# Patient Record
Sex: Female | Born: 1965 | Race: White | Hispanic: No | Marital: Married | Smoking: Never smoker
Health system: Southern US, Community
[De-identification: ages and names within clinical notes are randomized; demographics above are authoritative.]

## PROBLEM LIST (undated history)

## (undated) DIAGNOSIS — I10 Essential (primary) hypertension: Secondary | ICD-10-CM

---

## 2003-07-24 ENCOUNTER — Ambulatory Visit (HOSPITAL_COMMUNITY): Admission: RE | Admit: 2003-07-24 | Discharge: 2003-07-24 | Payer: Self-pay | Admitting: Family Medicine

## 2009-09-09 ENCOUNTER — Ambulatory Visit (HOSPITAL_BASED_OUTPATIENT_CLINIC_OR_DEPARTMENT_OTHER): Admission: RE | Admit: 2009-09-09 | Discharge: 2009-09-09 | Payer: Self-pay | Admitting: General Surgery

## 2010-04-11 LAB — POCT HEMOGLOBIN-HEMACUE: Hemoglobin: 14 g/dL (ref 12.0–15.0)

## 2010-07-29 ENCOUNTER — Telehealth (INDEPENDENT_AMBULATORY_CARE_PROVIDER_SITE_OTHER): Payer: Self-pay | Admitting: General Surgery

## 2010-07-29 NOTE — Telephone Encounter (Signed)
Patient called, spoke with Carol Ana A. Re: referral to Dr Yolanda Bonine for a mgm. Patient stated she found a lump in her breast and that Dr Yolanda Bonine needed an order to perform a mgm. Pt called 07/16/10. Dr Andrey Campanile was out of the office all week, but I did send him an email telling him what was going on with Carol Contreras on 07/17/10 in which he responded to order a mgm and ultrasound. I called patient and left a message for her to call me back. I did not hear anything and left another message 07/28/10 for patient to call back. At this time I am still awaiting a call back from the patient.

## 2010-09-17 ENCOUNTER — Encounter (INDEPENDENT_AMBULATORY_CARE_PROVIDER_SITE_OTHER): Payer: Self-pay | Admitting: General Surgery

## 2014-10-29 ENCOUNTER — Encounter (HOSPITAL_COMMUNITY): Payer: Self-pay | Admitting: *Deleted

## 2014-10-29 ENCOUNTER — Emergency Department (HOSPITAL_COMMUNITY): Payer: BC Managed Care – PPO

## 2014-10-29 ENCOUNTER — Observation Stay (HOSPITAL_COMMUNITY)
Admission: EM | Admit: 2014-10-29 | Discharge: 2014-10-30 | Disposition: A | Discharge status: Home / Self Care | Payer: BC Managed Care – PPO | Attending: Internal Medicine | Admitting: Internal Medicine

## 2014-10-29 DIAGNOSIS — M79605 Pain in left leg: Secondary | ICD-10-CM | POA: Diagnosis not present

## 2014-10-29 DIAGNOSIS — I10 Essential (primary) hypertension: Secondary | ICD-10-CM | POA: Insufficient documentation

## 2014-10-29 DIAGNOSIS — R079 Chest pain, unspecified: Principal | ICD-10-CM | POA: Insufficient documentation

## 2014-10-29 DIAGNOSIS — I160 Hypertensive urgency: Secondary | ICD-10-CM | POA: Diagnosis not present

## 2014-10-29 DIAGNOSIS — R072 Precordial pain: Secondary | ICD-10-CM

## 2014-10-29 HISTORY — DX: Essential (primary) hypertension: I10

## 2014-10-29 LAB — CBC
HCT: 39.3 % (ref 36.0–46.0)
HEMOGLOBIN: 13.3 g/dL (ref 12.0–15.0)
MCH: 31.3 pg (ref 26.0–34.0)
MCHC: 33.8 g/dL (ref 30.0–36.0)
MCV: 92.5 fL (ref 78.0–100.0)
Platelets: 349 10*3/uL (ref 150–400)
RBC: 4.25 MIL/uL (ref 3.87–5.11)
RDW: 11.9 % (ref 11.5–15.5)
WBC: 8 10*3/uL (ref 4.0–10.5)

## 2014-10-29 LAB — APTT: aPTT: 30 seconds (ref 24–37)

## 2014-10-29 LAB — PROTIME-INR
INR: 1.07 (ref 0.00–1.49)
PROTHROMBIN TIME: 14.1 s (ref 11.6–15.2)

## 2014-10-29 LAB — BASIC METABOLIC PANEL WITH GFR
Anion gap: 9 (ref 5–15)
BUN: 12 mg/dL (ref 6–20)
CO2: 25 mmol/L (ref 22–32)
Calcium: 8.7 mg/dL — ABNORMAL LOW (ref 8.9–10.3)
Chloride: 101 mmol/L (ref 101–111)
Creatinine, Ser: 0.82 mg/dL (ref 0.44–1.00)
GFR calc Af Amer: >60 mL/min
GFR calc non Af Amer: >60 mL/min
Glucose, Bld: 100 mg/dL — ABNORMAL HIGH (ref 65–99)
Potassium: 3.5 mmol/L (ref 3.5–5.1)
Sodium: 135 mmol/L (ref 135–145)

## 2014-10-29 LAB — I-STAT TROPONIN, ED
TROPONIN I, POC: 0 ng/mL (ref 0.00–0.08)
Troponin i, poc: 0 ng/mL (ref 0.00–0.08)

## 2014-10-29 MED ORDER — LISINOPRIL 20 MG PO TABS
20.0000 mg | ORAL_TABLET | Freq: Every day | ORAL | Status: DC
  Administered 2014-10-30: 20 mg via ORAL
  Filled 2014-10-29: qty 1

## 2014-10-29 MED ORDER — ASPIRIN EC 81 MG PO TBEC: 81.0000 mg | DELAYED_RELEASE_TABLET | Freq: Every day | ORAL | Status: DC

## 2014-10-29 MED ORDER — MORPHINE SULFATE (PF) 2 MG/ML IV SOLN: 2.0000 mg | INTRAVENOUS | Status: DC | PRN

## 2014-10-29 MED ORDER — NITROGLYCERIN 0.4 MG SL SUBL: 0.4000 mg | SUBLINGUAL_TABLET | SUBLINGUAL | Status: DC | PRN

## 2014-10-29 MED ORDER — SODIUM CHLORIDE 0.9 % IJ SOLN
3.0000 mL | Freq: Two times a day (BID) | INTRAMUSCULAR | Status: DC
  Administered 2014-10-29 – 2014-10-30 (×2): 3 mL via INTRAVENOUS

## 2014-10-29 MED ORDER — NITROGLYCERIN 0.2 MG/HR TD PT24
0.2000 mg | MEDICATED_PATCH | Freq: Every day | TRANSDERMAL | Status: DC
  Administered 2014-10-30: 0.2 mg via TRANSDERMAL
  Filled 2014-10-29: qty 1

## 2014-10-29 MED ORDER — ACETAMINOPHEN 650 MG RE SUPP: 650.0000 mg | Freq: Four times a day (QID) | RECTAL | Status: DC | PRN

## 2014-10-29 MED ORDER — ONDANSETRON HCL 4 MG/2ML IJ SOLN: 4.0000 mg | Freq: Four times a day (QID) | INTRAMUSCULAR | Status: DC | PRN

## 2014-10-29 MED ORDER — NITROGLYCERIN 0.4 MG SL SUBL
0.4000 mg | SUBLINGUAL_TABLET | SUBLINGUAL | Status: DC | PRN
Start: 2014-10-29 — End: 2014-10-29
  Administered 2014-10-29: 0.4 mg via SUBLINGUAL
  Filled 2014-10-29: qty 1

## 2014-10-29 MED ORDER — SODIUM CHLORIDE 0.9 % IV SOLN
INTRAVENOUS | Status: DC
  Administered 2014-10-29: 23:00:00 via INTRAVENOUS

## 2014-10-29 MED ORDER — ONDANSETRON HCL 4 MG PO TABS: 4.0000 mg | ORAL_TABLET | Freq: Four times a day (QID) | ORAL | Status: DC | PRN

## 2014-10-29 MED ORDER — HEPARIN SODIUM (PORCINE) 5000 UNIT/ML IJ SOLN
5000.0000 [IU] | Freq: Three times a day (TID) | INTRAMUSCULAR | Status: DC
  Administered 2014-10-30: 5000 [IU] via SUBCUTANEOUS
  Filled 2014-10-29: qty 1

## 2014-10-29 MED ORDER — ATORVASTATIN CALCIUM 40 MG PO TABS
40.0000 mg | ORAL_TABLET | Freq: Every day | ORAL | Status: DC
  Administered 2014-10-29: 40 mg via ORAL
  Filled 2014-10-29: qty 1

## 2014-10-29 MED ORDER — ASPIRIN EC 325 MG PO TBEC
325.0000 mg | DELAYED_RELEASE_TABLET | Freq: Every day | ORAL | Status: DC
  Administered 2014-10-30: 325 mg via ORAL
  Filled 2014-10-29: qty 1

## 2014-10-29 MED ORDER — HYDRALAZINE HCL 20 MG/ML IJ SOLN: 5.0000 mg | INTRAMUSCULAR | Status: DC | PRN

## 2014-10-29 MED ORDER — LISINOPRIL 20 MG PO TABS: 20.0000 mg | ORAL_TABLET | Freq: Every day | ORAL | Status: DC

## 2014-10-29 MED ORDER — ALUM & MAG HYDROXIDE-SIMETH 200-200-20 MG/5ML PO SUSP: 30.0000 mL | Freq: Four times a day (QID) | ORAL | Status: DC | PRN

## 2014-10-29 MED ORDER — ACETAMINOPHEN 500 MG PO TABS
1000.0000 mg | ORAL_TABLET | Freq: Once | ORAL | Status: DC
  Filled 2014-10-29: qty 2

## 2014-10-29 MED ORDER — ACETAMINOPHEN 325 MG PO TABS
650.0000 mg | ORAL_TABLET | Freq: Four times a day (QID) | ORAL | Status: DC | PRN
  Administered 2014-10-30: 650 mg via ORAL
  Filled 2014-10-29: qty 2

## 2014-10-29 NOTE — ED Provider Notes (Signed)
CSN: [id number]     Arrival date & time 10/29/14  1619 History   First MD Initiated Contact with Patient 10/29/14 1902     Chief Complaint  Patient presents with  . Chest Pain  . Hypertension     (Consider location/radiation/quality/duration/timing/severity/associated sxs/prior Treatment) Patient is a 49 y.o. female presenting with chest pain and hypertension. The history is provided by the patient.  Chest Pain Pain location:  L chest Pain quality: pressure   Pain radiates to:  Neck Pain severity:  Moderate Onset quality:  Gradual Timing:  Constant Progression:  Unchanged Chronicity:  New Context: at rest   Relieved by:  Nitroglycerin Worsened by:  Nothing tried Associated symptoms: no abdominal pain, no cough, no fever, no shortness of breath and not vomiting   Hypertension Associated symptoms include chest pain. Pertinent negatives include no abdominal pain and no shortness of breath.    History reviewed. No pertinent past medical history. History reviewed. No pertinent past surgical history. History reviewed. No pertinent family history. Social History  Substance Use Topics  . Smoking status: Never Smoker   . Smokeless tobacco: None  . Alcohol Use: Yes     Comment: occ   OB History    No data available     Review of Systems  Constitutional: Negative for fever.  Respiratory: Negative for cough and shortness of breath.   Cardiovascular: Positive for chest pain.  Gastrointestinal: Negative for vomiting and abdominal pain.  All other systems reviewed and are negative.     Allergies  Review of patient's allergies indicates no known allergies.  Home Medications   Prior to Admission medications   Not on File   BP 175/85 mmHg  Pulse 63  Temp(Src) 98.3 F (36.8 C) (Oral)  Resp 22  Ht  (1.575 m)  Wt 152 lb (68.947 kg)  BMI 27.79 kg/m2  SpO2 100%  LMP 10/22/2014 Physical Exam  Constitutional: She is oriented to person, place, and time. She appears  well-developed and well-nourished. No distress.  HENT:  Head: Normocephalic and atraumatic.  Mouth/Throat: Oropharynx is clear and moist.  Eyes: EOM are normal. Pupils are equal, round, and reactive to light.  Neck: Normal range of motion. Neck supple.  Cardiovascular: Normal rate and regular rhythm.  Exam reveals no friction rub.   No murmur heard. Pulmonary/Chest: Effort normal and breath sounds normal. No respiratory distress. She has no wheezes. She has no rales.  Abdominal: Soft. She exhibits no distension. There is no tenderness. There is no rebound.  Musculoskeletal: Normal range of motion. She exhibits no edema.  Neurological: She is alert and oriented to person, place, and time.  Skin: No rash noted. She is not diaphoretic.  Nursing note and vitals reviewed.   ED Course  Procedures (including critical care time) Labs Review Labs Reviewed  BASIC METABOLIC PANEL - Abnormal; Notable for the following:    Glucose, Bld 100 (*)    Calcium 8.7 (*)    All other components within normal limits  CBC  I-STAT TROPOININ, ED  I-STAT TROPOININ, ED    Imaging Review Dg Chest 2 View  10/29/2014   CLINICAL DATA:  Chest pain radiating to the left shoulder with elevated blood pressure. Initial encounter.  EXAM: CHEST  2 VIEW  COMPARISON:  07/24/2003 radiographs.  FINDINGS: The heart size and mediastinal contours are normal. The lungs are clear. There is no pleural effusion or pneumothorax. No acute osseous findings are identified.  IMPRESSION: Stable chest.  No active cardiopulmonary  process.   Electronically Signed   By: Carey Bullocks M.D.   On: 10/29/2014 18:07   I have personally reviewed and evaluated these images and lab results as part of my medical decision-making.   EKG Interpretation   Date/Time:  Monday October 29 2014 16:28:48 EDT Ventricular Rate:  79 PR Interval:  130 QRS Duration: 74 QT Interval:  392 QTC Calculation: 449 R Axis:   67 Text Interpretation:  Normal  sinus rhythm Possible Left atrial enlargement  Nonspecific ST abnormality Abnormal ECG No prior for comparison Confirmed  by Gwendolyn Grant  MD, Kerrigan Glendening ([id number]) on 10/29/2014 7:07:05 PM      MDM   Final diagnoses:  Chest pain, unspecified chest pain type    49 year old female here with chest pain concerning for ACS. EKG is normal and her initial troponin is normal. She did have chest pain that was responsive to nitroglycerin. He still having mild, 2 out of 10 chest pain here. Given nitroglycerin with relief. I spoke with Dr. Shirlee Latch of cardiology, he recommended hospitalist admission. Heart score is 4 and I feel she is to be admitted. Patient amenable to staying.    Elwin Mocha, MD 10/30/14 [phone]

## 2014-10-29 NOTE — ED Notes (Signed)
MD at bedside. 

## 2014-10-29 NOTE — ED Notes (Addendum)
Pt reports recent HTN, has appt at pcp tomorrow but having mid to upper chest heaviness since last night. Reports SBP 190 pta, pt went to pcp office and was given  ASA and 1 nitro. Denies sob. ekg done on arrival.

## 2014-10-29 NOTE — H&P (Signed)
[facility] History and Physical  Carol Contreras EAV:[id number] DOB: 13-Mar-1965 DOA: 10/29/2014  Referring physician: ED physician PCP: Neldon Labella, MD  Specialists:   Chief Complaint: Chest pain  HPI: Carol Contreras is a 49 y.o. female with PMH of hypertension, not on medications, who presents with chest pain.  Periprostatic she has hypertension for more than 5 years, but not taking any medications at home. She started having chest pain since last night. Chest pain is located in the left chest, constant, 7 out of 10 in severity, nonradiating. It is not aggravated or alleviated by any known factors. No cough, fever or chills. She reports that she has gurgling in her heart in the night. Reports that she has a left leg pain for long time, which only happens in the night. It is alleviated by moving. She does not have back pain. Patient does not have abdominal pain, nausea, vomiting, diarrhea, rashes, unilateral weakness. Patient was treated with aspirin and nitroglycerin in Ed. Currently her chest pain is mild, 1 out of 10 in severity.  In ED, patient was found to have elevated blood pressure with SBP 190, negative troponin, negative chest x-ray, temperature normal, no tachycardia, electrolytes okay. Patient is admitted to inpatient for further eval and treatment. Cardiology was consulted to the ED.  Where does patient live?   At home  Can patient participate in ADLs?  Yes      Review of Systems:   General: no fevers, chills, no changes in body weight, has fatigue HEENT: no blurry vision, hearing changes or sore throat Pulm: no dyspnea, coughing, wheezing CV: has chest pain, no palpitations Abd: no nausea, vomiting, abdominal pain, diarrhea, constipation GU: no dysuria, burning on urination, increased urinary frequency, hematuria  Ext: no leg edema. Has left leg pain. Neuro: no unilateral weakness, numbness, or tingling, no vision change or hearing loss Skin: no rash MSK: No  muscle spasm, no deformity, no limitation of range of movement in spin Heme: No easy bruising.  Travel history: No recent long distant travel.  Allergy: No Known Allergies  Past Medical History  Diagnosis Date  . Essential hypertension     History reviewed. No pertinent past surgical history.  Social History:  reports that she has never smoked. She does not have any smokeless tobacco history on file. She reports that she drinks alcohol. She reports that she does not use illicit drugs.  Family History:  Family History  Problem Relation Age of Onset  . Heart disease Mother   . Hypertension Father   . Hypertension Sister   . Drug abuse Brother      Prior to Admission medications   Not on File    Physical Exam: Filed Vitals:   10/29/14 1945 10/29/14 2000 10/29/14 2237 10/30/14 0500  BP: 168/85 175/85 153/81 163/80  Pulse: 59 63 69 55  Temp:   98 F (36.7 C) 98.4 F (36.9 C)  TempSrc:    Oral  Resp: Height:    (1.575 m)   Weight:   70.897 kg (156 lb 4.8 oz)   SpO2: 99% 100% 95% 97%   General: Not in acute distress HEENT:       Eyes: PERRL, EOMI, no scleral icterus.       ENT: No discharge from the ears and nose, no pharynx injection, no tonsillar enlargement.        Neck: No JVD, no bruit, no mass felt. Heme: No neck lymph node enlargement.  Cardiac: S1/S2, RRR, No murmurs, No gallops or rubs. Pulm: No rales, wheezing, rhonchi or rubs. Abd: Soft, nondistended, nontender, no rebound pain, no organomegaly, BS present. Ext: No pitting leg edema bilaterally. 2+DP/PT pulse bilaterally. Musculoskeletal: No joint deformities, No joint redness or warmth, no limitation of ROM in spin. Skin: No rashes.  Neuro: Alert, oriented X3, cranial nerves II-XII grossly intact, muscle strength 5/5 in all extremities, sensation to light touch intact. Brachial reflex 2+ bilaterally. Knee reflex 1+ bilaterally. Negative Babinski's sign. Normal finger to nose test. Psych:  Patient is not psychotic, no suicidal or hemocidal ideation.  Labs on Admission:  Basic Metabolic Panel:  Recent Labs Lab 10/29/14 1654 10/30/14 0450  NA 135 140  K 3.5 4.8  CL 101 108  CO2 25 27  GLUCOSE 100* 101*  BUN 12 15  CREATININE 0.82 0.80  CALCIUM 8.7* 8.5*   Liver Function Tests:  Recent Labs Lab 10/30/14 0450  AST 12*  ALT 11*  ALKPHOS 64  BILITOT 0.7  PROT 5.4*  ALBUMIN 3.1*   No results for input(s): LIPASE, AMYLASE in the last 168 hours. No results for input(s): AMMONIA in the last 168 hours. CBC:  Recent Labs Lab 10/29/14 1654 10/30/14 0450  WBC 8.0 5.7  HGB 13.3 11.9*  HCT 39.3 35.2*  MCV 92.5 93.9  PLT 349 273   Cardiac Enzymes:  Recent Labs Lab 10/29/14 2320 10/30/14 0450  TROPONINI <0.03 <0.03    BNP (last 3 results) No results for input(s): BNP in the last 8760 hours.  ProBNP (last 3 results) No results for input(s): PROBNP in the last 8760 hours.  CBG: No results for input(s): GLUCAP in the last 168 hours.  Radiological Exams on Admission: Dg Chest 2 View  10/29/2014   CLINICAL DATA:  Chest pain radiating to the left shoulder with elevated blood pressure. Initial encounter.  EXAM: CHEST  2 VIEW  COMPARISON:  07/24/2003 radiographs.  FINDINGS: The heart size and mediastinal contours are normal. The lungs are clear. There is no pleural effusion or pneumothorax. No acute osseous findings are identified.  IMPRESSION: Stable chest.  No active cardiopulmonary process.   Electronically Signed   By: Carey Bullocks M.D.   On: 10/29/2014 18:07    EKG: Independently reviewed.  Abnormal findings:  Slight ST depression in V5-V6   Assessment/Plan Principal Problem:   Chest pain Active Problems:   Hypertensive urgency   Leg pain, left  Chest pain: It is most likely due to demanding ischemic secondary to hypertensive urgency. Troponin was negative. EKG showed mild ST depression in V5-V6. Cardiology was consulted by ED.  - will admit  to Tele bed  - cycle CE q6 x3 and repeat her EKG in the am  - Nitroglycerin patch, prn Morphine, and aspirin, lipitor  - Risk factor stratification: will check FLP,  A1C, and UDS - 2d ehco - f/u cardiology recommendations  Hypertensive urgency: -Start lisinopril. 20 mg daily -IV hydralazine when necessary -on NTG patch  Leg pain, left: Etiology is not clear. No alarming symptoms. Neurologic examination is normal  -prn tylenol    DVT ppx: SQ Heparin    Code Status: Full code Family Communication: None at bed side. Disposition Plan: Admit to inpatient   Date of Service 10/30/2014    Lorretta Harp [facility] Pager [phone]  If 7PM-7AM, please contact night-coverage [email or web address] Password [facility] 10/30/2014, 6:49 AM

## 2014-10-30 ENCOUNTER — Encounter (HOSPITAL_COMMUNITY): Payer: Self-pay | Admitting: Internal Medicine

## 2014-10-30 ENCOUNTER — Inpatient Hospital Stay (HOSPITAL_COMMUNITY): Payer: BC Managed Care – PPO

## 2014-10-30 ENCOUNTER — Other Ambulatory Visit (HOSPITAL_COMMUNITY): Payer: BC Managed Care – PPO

## 2014-10-30 DIAGNOSIS — R072 Precordial pain: Secondary | ICD-10-CM

## 2014-10-30 DIAGNOSIS — M79605 Pain in left leg: Secondary | ICD-10-CM | POA: Diagnosis present

## 2014-10-30 DIAGNOSIS — I160 Hypertensive urgency: Secondary | ICD-10-CM | POA: Diagnosis not present

## 2014-10-30 DIAGNOSIS — R079 Chest pain, unspecified: Secondary | ICD-10-CM | POA: Diagnosis not present

## 2014-10-30 DIAGNOSIS — I10 Essential (primary) hypertension: Secondary | ICD-10-CM | POA: Diagnosis not present

## 2014-10-30 LAB — CBC
HCT: 35.2 % — ABNORMAL LOW (ref 36.0–46.0)
Hemoglobin: 11.9 g/dL — ABNORMAL LOW (ref 12.0–15.0)
MCH: 31.7 pg (ref 26.0–34.0)
MCHC: 33.8 g/dL (ref 30.0–36.0)
MCV: 93.9 fL (ref 78.0–100.0)
PLATELETS: 273 10*3/uL (ref 150–400)
RBC: 3.75 MIL/uL — ABNORMAL LOW (ref 3.87–5.11)
RDW: 12.1 % (ref 11.5–15.5)
WBC: 5.7 10*3/uL (ref 4.0–10.5)

## 2014-10-30 LAB — TROPONIN I: Troponin I: <0.03 ng/mL (ref <0.031)

## 2014-10-30 LAB — LIPID PANEL
CHOL/HDL RATIO: 2.9 ratio
Cholesterol: 182 mg/dL (ref 0–200)
HDL: 63 mg/dL (ref >40)
LDL CALC: 97 mg/dL (ref 0–99)
TRIGLYCERIDES: 110 mg/dL (ref <150)
VLDL: 22 mg/dL (ref 0–40)

## 2014-10-30 LAB — COMPREHENSIVE METABOLIC PANEL
ALBUMIN: 3.1 g/dL — AB (ref 3.5–5.0)
ALT: 11 U/L — ABNORMAL LOW (ref 14–54)
ANION GAP: 5 (ref 5–15)
AST: 12 U/L — ABNORMAL LOW (ref 15–41)
Alkaline Phosphatase: 64 U/L (ref 38–126)
BUN: 15 mg/dL (ref 6–20)
CHLORIDE: 108 mmol/L (ref 101–111)
CO2: 27 mmol/L (ref 22–32)
Calcium: 8.5 mg/dL — ABNORMAL LOW (ref 8.9–10.3)
Creatinine, Ser: 0.8 mg/dL (ref 0.44–1.00)
GFR calc non Af Amer: >60 mL/min (ref >60)
GLUCOSE: 101 mg/dL — AB (ref 65–99)
POTASSIUM: 4.8 mmol/L (ref 3.5–5.1)
SODIUM: 140 mmol/L (ref 135–145)
Total Bilirubin: 0.7 mg/dL (ref 0.3–1.2)
Total Protein: 5.4 g/dL — ABNORMAL LOW (ref 6.5–8.1)

## 2014-10-30 LAB — NM MYOCAR MULTI W/SPECT W/WALL MOTION / EF
CSEPED: 8 min
Estimated workload: 10.1 METS
Exercise duration (sec): 44 s
MPHR: 170 {beats}/min
Peak HR: 462 {beats}/min
Percent HR: 95 %
Rest HR: 84 {beats}/min

## 2014-10-30 LAB — RAPID URINE DRUG SCREEN, HOSP PERFORMED
Amphetamines: NOT DETECTED
BARBITURATES: NOT DETECTED
BENZODIAZEPINES: NOT DETECTED
COCAINE: NOT DETECTED
OPIATES: NOT DETECTED
TETRAHYDROCANNABINOL: NOT DETECTED

## 2014-10-30 LAB — HCG, QUANTITATIVE, PREGNANCY: hCG, Beta Chain, Quant, S: 1 m[IU]/mL (ref <5)

## 2014-10-30 MED ORDER — TECHNETIUM TC 99M SESTAMIBI GENERIC - CARDIOLITE
10.0000 | Freq: Once | INTRAVENOUS | Status: AC | PRN
  Administered 2014-10-30: 10 via INTRAVENOUS

## 2014-10-30 MED ORDER — METOPROLOL SUCCINATE ER 50 MG PO TB24: 50.0000 mg | ORAL_TABLET | Freq: Every day | ORAL | Status: AC

## 2014-10-30 MED ORDER — TRAZODONE HCL 50 MG PO TABS: 50.0000 mg | ORAL_TABLET | Freq: Every evening | ORAL | Status: AC | PRN

## 2014-10-30 MED ORDER — TECHNETIUM TC 99M SESTAMIBI GENERIC - CARDIOLITE
30.0000 | Freq: Once | INTRAVENOUS | Status: AC | PRN
  Administered 2014-10-30: 30 via INTRAVENOUS

## 2014-10-30 NOTE — Progress Notes (Signed)
     The patient was seen in nuclear medicine for a exercise myoview. She tolerated the procedure well. No acute ST or TW changes on ECG. Await nuclear images.    Thereasa Parkin PA-C  [facility]

## 2014-10-30 NOTE — Discharge Summary (Signed)
Physician Discharge Summary  Carol Contreras ZOX:[id number] DOB: 1965/10/04 DOA: 10/29/2014  PCP: Neldon Labella, MD  Admit date: 10/29/2014 Discharge date: 10/30/2014   Recommendations for Outpatient Follow-up:  1. If chest pain continues on empiric PPI, consider EGD 2. Monitor blood pressure  Discharge Diagnoses:  Principal Problem:   Chest pain Active Problems:   Hypertensive urgency insomnia   Discharge Condition: stable  Diet recommendation: heart healthy  Filed Weights   10/29/14 1630 10/29/14 2237  Weight: 68.947 kg (152 lb) 70.897 kg (156 lb 4.8 oz)    History of present illness:  49 y.o. female with PMH of hypertension, not on medications, who presents with chest pain.  Periprostatic she has hypertension for more than 5 years, but not taking any medications at home. She started having chest pain since last night. Chest pain is located in the left chest, constant, 7 out of 10 in severity, nonradiating. It is not aggravated or alleviated by any known factors. No cough, fever or chills. She reports that she has gurgling in her heart in the night. Reports that she has a left leg pain for long time, which only happens in the night. It is alleviated by moving. She does not have back pain. Patient does not have abdominal pain, nausea, vomiting, diarrhea, rashes, unilateral weakness. Patient was treated with aspirin and nitroglycerin in Ed. Currently her chest pain is mild, 1 out of 10 in severity.  In ED, patient was found to have elevated blood pressure with SBP 190, negative troponin, negative chest x-ray, temperature normal, no tachycardia, electrolytes okay. Patient is admitted to inpatient for further eval and treatment. Cardiology was consulted to the ED.  Hospital Course:  Observed on telemetry.  Started on antihypertensives.  MI ruled out. Pain and blood pressure improved. Had low risk myoview. Will start trial of PPI in case GERD.   Will need outpt blood pressure check.   If chest pain continues, consider EGD.  Patient also asked for medication for insomnia.  rx for trazodone given and f/u PCP  Procedures:  none  Consultations:  none  Discharge Exam: Filed Vitals:   10/30/14 1457  BP: 143/75  Pulse: 79  Temp: 97.6 F (36.4 C)  Resp:     General: comfortable Cardiovascular: RRR. No chest wall tenderness Respiratory: CTA without WRR  Discharge Instructions   Discharge Instructions    Diet - low sodium heart healthy    Complete by:  As directed           Current Discharge Medication List    START taking these medications   Details  metoprolol succinate (TOPROL XL) 50 MG 24 hr tablet Take 1 tablet (50 mg total) by mouth daily. Take with or immediately following a meal. Qty: 30 tablet, Refills: 0    traZODone (DESYREL) 50 MG tablet Take 1 tablet (50 mg total) by mouth at bedtime as needed for sleep. Qty: 30 tablet, Refills: 0       No Known Allergies Follow-up Information    Follow up with Neldon Labella, MD.   Specialty:  Family Medicine   Contact information:   [address] [place] [address] [phone]        The results of significant diagnostics from this hospitalization (including imaging, microbiology, ancillary and laboratory) are listed below for reference.    Significant Diagnostic Studies: Dg Chest 2 View  10/29/2014   CLINICAL DATA:  Chest pain radiating to the left shoulder with elevated blood pressure. Initial encounter.  EXAM: CHEST  2 VIEW  COMPARISON:  07/24/2003 radiographs.  FINDINGS: The heart size and mediastinal contours are normal. The lungs are clear. There is no pleural effusion or pneumothorax. No acute osseous findings are identified.  IMPRESSION: Stable chest.  No active cardiopulmonary process.   Electronically Signed   By: Carey Bullocks M.D.   On: 10/29/2014 18:07   Nm Myocar Multi W/spect W/wall Motion / Ef  10/30/2014   CLINICAL DATA:  49 year old with precordial chest pain.   EXAM: MYOCARDIAL IMAGING WITH SPECT (REST AND EXERCISE)  GATED LEFT VENTRICULAR WALL MOTION STUDY  LEFT VENTRICULAR EJECTION FRACTION  TECHNIQUE: Standard myocardial SPECT imaging was performed after resting intravenous injection of 10 mCi Tc-56m sestamibi. Subsequently, exercise tolerance test was performed by the patient under the supervision of the Cardiology staff. At peak-stress, 30 mCi Tc-67m sestamibi was injected intravenously and standard myocardial SPECT imaging was performed. Quantitative gated imaging was also performed to evaluate left ventricular wall motion, and estimate left ventricular ejection fraction.  COMPARISON:  Chest radiograph 10/29/2014  FINDINGS: Perfusion: No decreased activity in the left ventricle on stress imaging to suggest reversible ischemia or infarction.  Wall Motion: Normal left ventricular wall motion. No left ventricular dilation.  Left Ventricular Ejection Fraction: 61 %  End diastolic volume 82 ml  End systolic volume 32 ml  IMPRESSION: 1. No reversible ischemia or infarction.  2. Normal left ventricular wall motion.  3. Left ventricular ejection fraction is 61%.  4. Low-risk stress test findings*.  *2012 Appropriate Use Criteria for Coronary Revascularization Focused Update: J Am Coll Cardiol. 2012;59(9):857-881. http://content.dementiazones.com.aspx?articleid=1201161   Electronically Signed   By: Richarda Overlie M.D.   On: 10/30/2014 14:16    Microbiology: No results found for this or any previous visit (from the past 240 hour(s)).   Labs: Basic Metabolic Panel:  Recent Labs Lab 10/29/14 1654 10/30/14 0450  NA 135 140  K 3.5 4.8  CL 101 108  CO2 25 27  GLUCOSE 100* 101*  BUN 12 15  CREATININE 0.82 0.80  CALCIUM 8.7* 8.5*   Liver Function Tests:  Recent Labs Lab 10/30/14 0450  AST 12*  ALT 11*  ALKPHOS 64  BILITOT 0.7  PROT 5.4*  ALBUMIN 3.1*   No results for input(s): LIPASE, AMYLASE in the last 168 hours. No results for input(s): AMMONIA  in the last 168 hours. CBC:  Recent Labs Lab 10/29/14 1654 10/30/14 0450  WBC 8.0 5.7  HGB 13.3 11.9*  HCT 39.3 35.2*  MCV 92.5 93.9  PLT 349 273   Cardiac Enzymes:  Recent Labs Lab 10/29/14 2320 10/30/14 0450 10/30/14 1014  TROPONINI <0.03 <0.03 <0.03   BNP: BNP (last 3 results) No results for input(s): BNP in the last 8760 hours.  ProBNP (last 3 results) No results for input(s): PROBNP in the last 8760 hours.  CBG: No results for input(s): GLUCAP in the last 168 hours.     SignedChristiane Ha  [facility] 10/30/2014, 3:20 PM

## 2014-10-30 NOTE — Consult Note (Signed)
CARDIOLOGY CONSULT NOTE  Patient ID: Carol Contreras, MRN: [id number], DOB/AGE: May 21, 1965 49 y.o. Admit date: 10/29/2014 Date of Consult: 10/30/2014  Primary Physician: Neldon Labella, MD Primary Cardiologist: none Referring Physician: Dr Clyde Lundborg  Chief Complaint: Chest pain Reason for Consultation: Chest pain  HPI: This is a 48 year old woman with long-standing untreated hypertension who presents for evaluation of chest pain. Yesterday she began having chest pain and she went to her doctor. She had planned on seeing her physician today to begin treatment for hypertension. The patient has been checking her blood pressure periodically with readings as high as 190s over 100s. She describes substernal chest discomfort radiating to the left chest. She was given sublingual nitroglycerin and her symptoms resolved. Her blood pressure also came down into the normal range after nitroglycerin. She is chest pain-free this morning. She's had periodic chest pain over recent weeks. In the past she describes a feeling of her chest "gurgling" but the symptoms only last for a few seconds. She has no personal history of heart disease. She denies any previous hospitalizations. She does not smoke cigarettes.  She also associates headaches and a feeling of "heaviness" behind her eyes associated with her high blood pressure.  Medical History:  Past Medical History  Diagnosis Date  . Essential hypertension       Surgical History: History reviewed. No pertinent past surgical history.   Home Meds: Prior to Admission medications   Not on File    Inpatient Medications:  . acetaminophen  1,000 mg Oral Once  . aspirin EC  325 mg Oral Daily  . atorvastatin  40 mg Oral q1800  . heparin  5,000 Units Subcutaneous 3 times per day  . lisinopril  20 mg Oral Daily  . nitroGLYCERIN  0.2 mg Transdermal Daily  . sodium chloride  3 mL Intravenous Q12H   . sodium chloride 100 mL/hr at 10/29/14 2258    Allergies: No  Known Allergies  Social History   Social History  . Marital Status: Married    Spouse Name: N/A  . Number of Children: N/A  . Years of Education: N/A   Occupational History  . Not on file.   Social History Main Topics  . Smoking status: Never Smoker   . Smokeless tobacco: Not on file  . Alcohol Use: Yes     Comment: occ  . Drug Use: No  . Sexual Activity: Not on file   Other Topics Concern  . Not on file   Social History Narrative     Family History  Problem Relation Age of Onset  . Heart disease Mother   . Hypertension Father   . Hypertension Sister   . Drug abuse Brother      Review of Systems: General: negative for chills, fever, night sweats or weight changes.  ENT: negative for rhinorrhea or epistaxis Cardiovascular: See history of present illness Dermatological: negative for rash Respiratory: negative for cough or wheezing GI: negative for nausea, vomiting, diarrhea, bright red blood per rectum, melena, or hematemesis GU: no hematuria, urgency, or frequency Neurologic: negative for visual changes, syncope, headache, or dizziness Heme: no easy bruising or bleeding Endo: negative for excessive thirst, thyroid disorder, or flushing Musculoskeletal: Positive for left knee pain   All other systems reviewed and are otherwise negative except as noted above.  Physical Exam: Blood pressure 163/80, pulse 55, temperature 98.4 F (36.9 C), temperature source Oral, resp. rate 18, height  (1.575 m), weight 156 lb 4.8 oz (70.897  kg), last menstrual period 10/22/2014, SpO2 97 %. Pt is alert and oriented, WD, WN, in no distress. HEENT: normal Neck: JVP normal. Carotid upstrokes normal without bruits. No thyromegaly. Lungs: equal expansion, clear bilaterally CV: Apex is discrete and nondisplaced, RRR without murmur or gallop Abd: soft, NT, +BS, no bruit, no hepatosplenomegaly Back: no CVA tenderness Ext: no C/C/E        DP/PT pulses intact and = Skin: warm and  dry without rash Neuro: CNII-XII intact             Strength intact = bilaterally    Labs:  Recent Labs  10/29/14 2320 10/30/14 0450  TROPONINI <0.03 <0.03   Lab Results  Component Value Date   WBC 5.7 10/30/2014   HGB 11.9* 10/30/2014   HCT 35.2* 10/30/2014   MCV 93.9 10/30/2014   PLT 273 10/30/2014    Recent Labs Lab 10/30/14 0450  NA 140  K 4.8  CL 108  CO2 27  BUN 15  CREATININE 0.80  CALCIUM 8.5*  PROT 5.4*  BILITOT 0.7  ALKPHOS 64  ALT 11*  AST 12*  GLUCOSE 101*   Lab Results  Component Value Date   CHOL 182 10/30/2014   HDL 63 10/30/2014   LDLCALC 97 10/30/2014   TRIG 110 10/30/2014   No results found for: DDIMER  Radiology/Studies:  Dg Chest 2 View  10/29/2014   CLINICAL DATA:  Chest pain radiating to the left shoulder with elevated blood pressure. Initial encounter.  EXAM: CHEST  2 VIEW  COMPARISON:  07/24/2003 radiographs.  FINDINGS: The heart size and mediastinal contours are normal. The lungs are clear. There is no pleural effusion or pneumothorax. No acute osseous findings are identified.  IMPRESSION: Stable chest.  No active cardiopulmonary process.   Electronically Signed   By: Carey Bullocks M.D.   On: 10/29/2014 18:07    EKG: Normal sinus rhythm, within normal limits  Cardiac Studies: Troponin negative 3  ASSESSMENT AND PLAN:  1. Substernal chest pain, moderate risk for acute coronary syndrome 2. Hypertensive urgency - blood pressure remains above goal but improved with administration of oral anti-hypertensive medications  Recommendations: The patient is counseled about the need for ongoing treatment of her hypertension which is been uncontrolled for many years. Will defer to the primary team for specific anti-hypertensive medications. Recommended an exercise Myoview stress test to evaluate the patient's chest pain and rule out cardiac ischemia. Further plans pending the results of her nuclear scan. As long as her stress test is  normal/low risk, I would anticipate discharge later today on oral antihypertensives therapy. Will follow-up on her stress test results. The patient has been started on aspirin and a statin drug. Lipids show a cholesterol of 182 and LDL 97. Pending the results of her stress test, a decision can be made whether she will need to stay on a statin drug.  SignedTonny Bollman MD, [facility] 10/30/2014, 8:50 AM

## 2014-10-31 LAB — HEMOGLOBIN A1C
HEMOGLOBIN A1C: 5.7 % — AB (ref 4.8–5.6)
Mean Plasma Glucose: 117 mg/dL

## 2014-11-16 ENCOUNTER — Telehealth (HOSPITAL_COMMUNITY): Payer: Self-pay | Admitting: *Deleted

## 2014-12-04 ENCOUNTER — Other Ambulatory Visit: Payer: Self-pay | Admitting: Cardiovascular Disease

## 2014-12-04 ENCOUNTER — Other Ambulatory Visit: Payer: Self-pay

## 2014-12-04 ENCOUNTER — Ambulatory Visit (HOSPITAL_COMMUNITY): Payer: BC Managed Care – PPO | Attending: Cardiovascular Disease

## 2014-12-04 DIAGNOSIS — I071 Rheumatic tricuspid insufficiency: Secondary | ICD-10-CM | POA: Insufficient documentation

## 2014-12-04 DIAGNOSIS — R079 Chest pain, unspecified: Secondary | ICD-10-CM

## 2014-12-04 DIAGNOSIS — I340 Nonrheumatic mitral (valve) insufficiency: Secondary | ICD-10-CM | POA: Insufficient documentation

## 2014-12-04 DIAGNOSIS — I10 Essential (primary) hypertension: Secondary | ICD-10-CM | POA: Diagnosis not present

## 2014-12-06 ENCOUNTER — Telehealth: Payer: Self-pay | Admitting: *Deleted

## 2014-12-06 NOTE — Telephone Encounter (Signed)
Received call here in the [place] office.  Patient inquiring abut her recent Echo and wanting results.  Patient stated that her PMD (Dr. Hyacinth MeekerMiller) had requested this test.  Patient has not been seen in office before, only the hospital.  Informed patient that I would forward her test results to the ordering provider (pmd).  Patient appreciative & verbalized understanding.

## 2014-12-10 ENCOUNTER — Telehealth: Payer: Self-pay | Admitting: Cardiovascular Disease

## 2014-12-10 NOTE — Telephone Encounter (Signed)
Left detailed message on the pt's voicemail with Echo results.

## 2014-12-10 NOTE — Telephone Encounter (Signed)
New message      Calling to get echo results.  Pt is a Engineer, siteschool teacher.  She said you can leave a detailed message on her cell phone

## 2017-08-28 IMAGING — DX DG CHEST 2V
2 series · 2 of 2 positions shown · non-contrast
Comparison: 07/24/2003 radiographs.

CLINICAL DATA: Chest pain radiating to the left shoulder with
elevated blood pressure. Initial encounter.

EXAM:
CHEST  2 VIEW

[w chest pa]
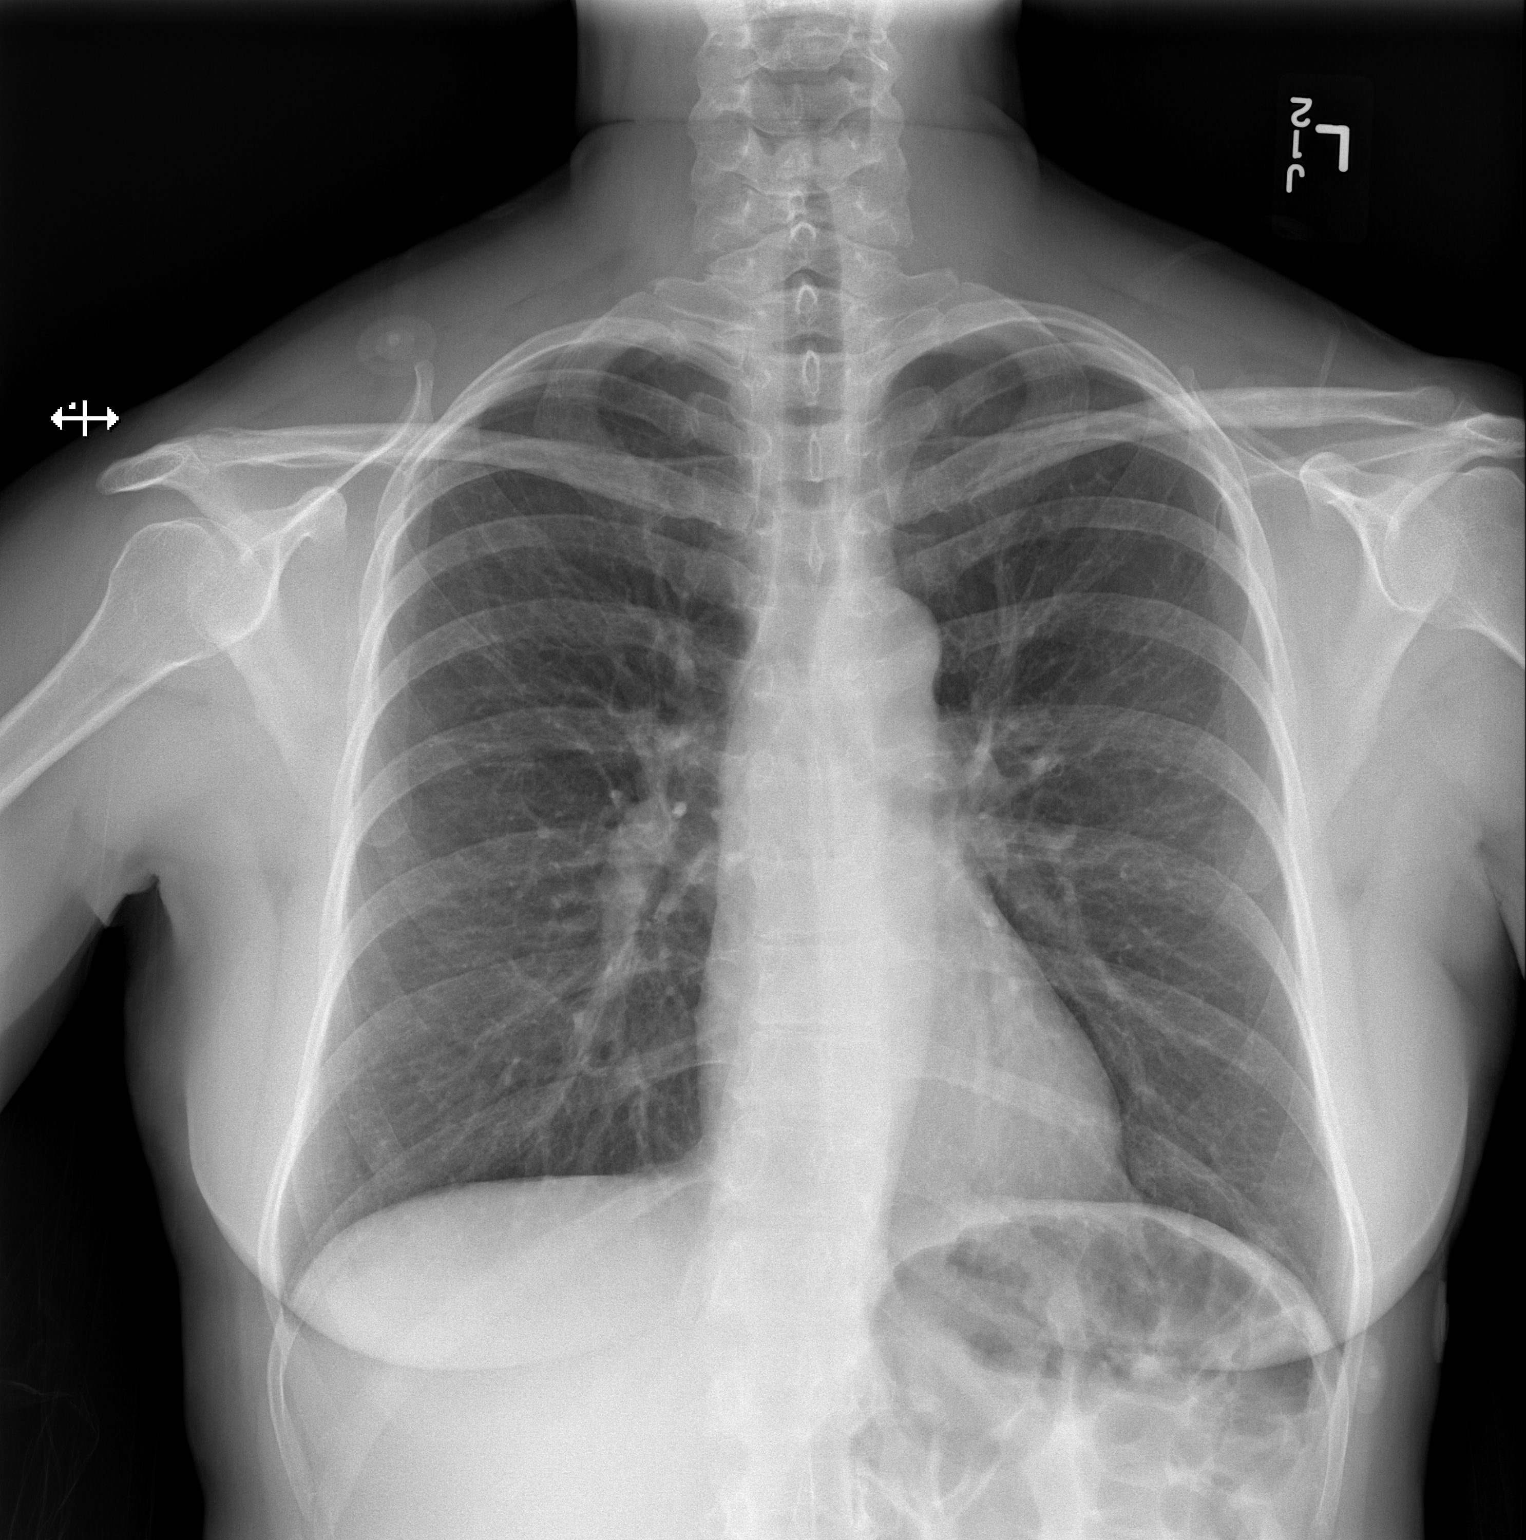

[w chest lat]
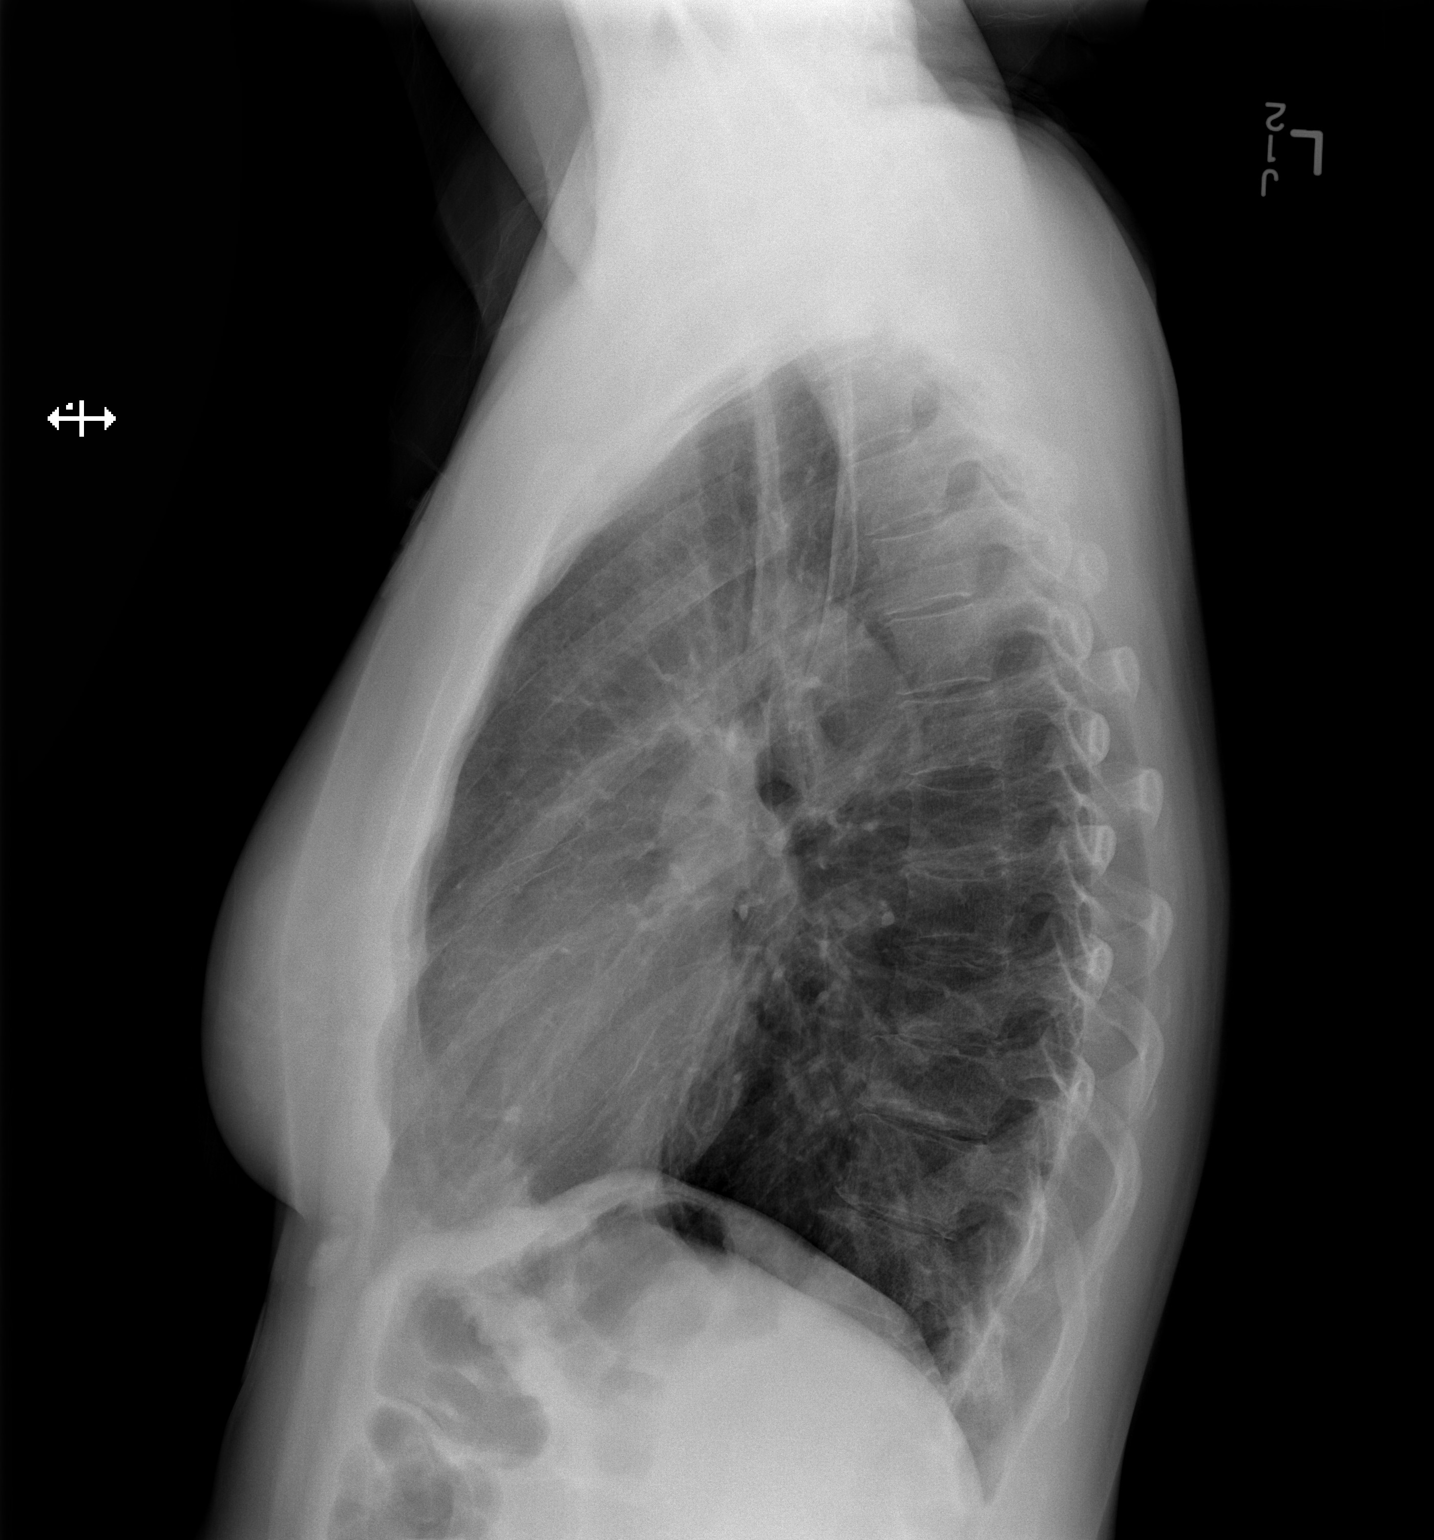

[2 of 2 positions shown; findings below may reference images not displayed]

FINDINGS: The heart size and mediastinal contours are normal. The lungs are
clear. There is no pleural effusion or pneumothorax. No acute
osseous findings are identified.
IMPRESSION: Stable chest.  No active cardiopulmonary process.

## 2019-03-23 ENCOUNTER — Ambulatory Visit: Payer: BC Managed Care – PPO | Attending: Internal Medicine

## 2019-03-23 DIAGNOSIS — Z23 Encounter for immunization: Secondary | ICD-10-CM | POA: Insufficient documentation

## 2019-03-23 NOTE — Progress Notes (Signed)
   Covid-19 Vaccination Clinic  Name:  Carol Contreras    MRN: [id number] DOB: 1965/12/08  03/23/2019  Carol Contreras was observed post Covid-19 immunization for 15 minutes without incidence. She was provided with Vaccine Information Sheet and instruction to access the V-Safe system.   Carol Contreras was instructed to call 911 with any severe reactions post vaccine: Marland Kitchen Difficulty breathing  . Swelling of your face and throat  . A fast heartbeat  . A bad rash all over your body  . Dizziness and weakness    Immunizations Administered    Name Date Dose VIS Date Route   Pfizer COVID-19 Vaccine 03/23/2019 11:54 AM 0.3 mL 01/06/2019 Intramuscular   Manufacturer: [employer], [employer]   Lot: [id number]   NDC: [address]-8

## 2019-04-12 ENCOUNTER — Ambulatory Visit: Payer: BC Managed Care – PPO | Attending: Internal Medicine

## 2019-04-12 DIAGNOSIS — Z23 Encounter for immunization: Secondary | ICD-10-CM

## 2019-04-12 NOTE — Progress Notes (Signed)
   Covid-19 Vaccination Clinic  Name:  Carol Contreras    MRN: [id number] DOB: 13-Jan-1966  04/12/2019  [name] was observed post Covid-19 immunization for 15 minutes without incident. She was provided with Vaccine Information Sheet and instruction to access the V-Safe system.   [name] was instructed to call 911 with any severe reactions post vaccine: Marland Kitchen Difficulty breathing  . Swelling of face and throat  . A fast heartbeat  . A bad rash all over body  . Dizziness and weakness   Immunizations Administered    Name Date Dose VIS Date Route   Pfizer COVID-19 Vaccine 04/12/2019  4:15 PM 0.3 mL 01/06/2019 Intramuscular   Manufacturer: [employer], [employer]   Lot: [id number]   NDC: [address]-9

## 2021-07-30 ENCOUNTER — Encounter: Payer: Self-pay | Admitting: Physician Assistant

## 2021-07-30 ENCOUNTER — Ambulatory Visit: Payer: BC Managed Care – PPO | Admitting: Physician Assistant

## 2021-07-30 DIAGNOSIS — M67441 Ganglion, right hand: Secondary | ICD-10-CM

## 2021-07-30 DIAGNOSIS — L821 Other seborrheic keratosis: Secondary | ICD-10-CM | POA: Diagnosis not present

## 2021-07-30 NOTE — Progress Notes (Signed)
   New Patient   Subjective  Carol Contreras is a 55 y.o. female who presents for the following: Skin Problem (Patient has lesion on her back her husband found x 1 week ago. ).   The following portions of the chart were reviewed this encounter and updated as appropriate:  Tobacco  Allergies  Meds  Problems  Med Hx  Surg Hx  Fam Hx      Objective  Well appearing patient in no apparent distress; mood and affect are within normal limits.  A full examination was performed including scalp, head, eyes, ears, nose, lips, neck, chest, axillae, abdomen, back, buttocks, bilateral upper extremities, bilateral lower extremities, hands, feet, fingers, toes, fingernails, and toenails. All findings within normal limits unless otherwise noted below.  Left Upper Back Stuck-on, crusted plaque.   Right Dorsal Mid 4th Finger Crusted nodule with small scab.   Assessment & Plan  Seborrheic keratosis Left Upper Back  Benign okay to leave if stable.  Digital mucous cyst of finger of right hand Right Dorsal Mid 4th Finger  Hand surgeon for removal if she would like.   No atypical nevi noted at the time of the visit.  I, Rickia Freeburg, PA-C, have reviewed all documentation's for this visit.  The documentation on 07/30/21 for the exam, diagnosis, procedures and orders are all accurate and complete.

## 2021-07-30 NOTE — Patient Instructions (Signed)
Seborrheic Keratosis A seborrheic keratosis is a common, noncancerous (benign) skin growth. These growths are velvety, waxy, rough, tan, brown, or black spots that appear on the skin. These skin growths can be flat or raised, and scaly. What are the causes? The cause of this condition is not known. What increases the risk? You are more likely to develop this condition if you: Have a family history of seborrheic keratosis. Are 50 or older. Are pregnant. Have had estrogen replacement therapy. What are the signs or symptoms? Symptoms of this condition include growths on the face, chest, shoulders, back, or other areas. These growths: Are usually painless, but may become irritated and itchy. Can be yellow, brown, black, or other colors. Are slightly raised or have a flat surface. Are sometimes rough or wart-like in texture. Are often velvety or waxy on the surface. Are round or oval-shaped. Often occur in groups, but may occur as a single growth. How is this diagnosed? This condition is diagnosed with a medical history and physical exam. A sample of the growth may be tested (skin biopsy). You may need to see a skin specialist (dermatologist). How is this treated? Treatment is not usually needed for this condition, unless the growths are irritated or bleed often. You may also choose to have the growths removed if you do not like their appearance. Most commonly, these growths are treated with a procedure in which liquid nitrogen is applied to "freeze" off the growth (cryosurgery). They may also be burned off with electricity (electrocautery) or removed by scraping (curettage). Follow these instructions at home: Watch your growth for any changes. Keep all follow-up visits as told by your health care provider. This is important. Do not scratch or pick at the growth or growths. This can cause them to become irritated or infected. Contact a health care provider if: You suddenly have many new  growths. Your growth bleeds, itches, or hurts. Your growth suddenly becomes larger or changes color. Summary A seborrheic keratosis is a common, noncancerous (benign) skin growth. Treatment is not usually needed for this condition, unless the growths are irritated or bleed often. Watch your growth for any changes. Contact a health care provider if you suddenly have many new growths or your growth suddenly becomes larger or changes color. Keep all follow-up visits as told by your health care provider. This is important. This information is not intended to replace advice given to you by your health care provider. Make sure you discuss any questions you have with your health care provider. Document Revised: 11/06/2020 Document Reviewed: 11/06/2020 Elsevier Patient Education  2023 [employer]
